# Patient Record
Sex: Male | Born: 2000 | Race: White | Hispanic: No | Marital: Single
Health system: Southern US, Community
[De-identification: ages and names within clinical notes are randomized; demographics above are authoritative.]

---
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
## 2020-04-10 DIAGNOSIS — Z0000 Encounter for general adult medical examination without abnormal findings: Secondary | ICD-10-CM | POA: Diagnosis not present

## 2020-04-10 DIAGNOSIS — Z1159 Encounter for screening for other viral diseases: Secondary | ICD-10-CM | POA: Diagnosis not present

## 2020-04-17 DIAGNOSIS — Z111 Encounter for screening for respiratory tuberculosis: Secondary | ICD-10-CM | POA: Diagnosis not present

## 2020-04-17 DIAGNOSIS — Z23 Encounter for immunization: Secondary | ICD-10-CM | POA: Diagnosis not present

## 2020-06-14 DIAGNOSIS — Z23 Encounter for immunization: Secondary | ICD-10-CM | POA: Diagnosis not present

## 2020-11-01 ENCOUNTER — Ambulatory Visit: Payer: Self-pay

## 2020-11-05 ENCOUNTER — Ambulatory Visit: Payer: Self-pay | Attending: Internal Medicine

## 2020-11-05 DIAGNOSIS — Z23 Encounter for immunization: Secondary | ICD-10-CM

## 2020-11-05 NOTE — Progress Notes (Signed)
   Covid-19 Vaccination Clinic  Name:  Steven Robbins    MRN: [id number] DOB: December 29, 2000  11/05/2020  Mr. Steven Robbins was observed post Covid-19 immunization for 15 minutes without incident. He was provided with Vaccine Information Sheet and instruction to access the V-Safe system.   Mr. Steven Robbins was instructed to call 911 with any severe reactions post vaccine: Marland Kitchen Difficulty breathing  . Swelling of face and throat  . A fast heartbeat  . A bad rash all over body  . Dizziness and weakness   Immunizations Administered    Name Date Dose VIS Date Route   Pfizer COVID-19 Vaccine 11/05/2020  1:53 PM 0.3 mL 09/04/2020 Intramuscular   Manufacturer: [employer], [employer]   Lot: [id number]   NDC: [id number]

## 2022-01-12 ENCOUNTER — Emergency Department (HOSPITAL_COMMUNITY)
Admission: EM | Admit: 2022-01-12 | Discharge: 2022-01-13 | Discharge status: Against Medical Advice | Payer: BC Managed Care – PPO | Attending: Emergency Medicine | Admitting: Emergency Medicine

## 2022-01-12 ENCOUNTER — Encounter (HOSPITAL_COMMUNITY): Payer: Self-pay

## 2022-01-12 DIAGNOSIS — Z5321 Procedure and treatment not carried out due to patient leaving prior to being seen by health care provider: Secondary | ICD-10-CM | POA: Diagnosis not present

## 2022-01-12 DIAGNOSIS — R1031 Right lower quadrant pain: Secondary | ICD-10-CM | POA: Insufficient documentation

## 2022-01-12 DIAGNOSIS — R109 Unspecified abdominal pain: Secondary | ICD-10-CM | POA: Diagnosis not present

## 2022-01-12 DIAGNOSIS — R1110 Vomiting, unspecified: Secondary | ICD-10-CM | POA: Diagnosis not present

## 2022-01-12 DIAGNOSIS — R112 Nausea with vomiting, unspecified: Secondary | ICD-10-CM | POA: Diagnosis not present

## 2022-01-12 MED ORDER — ONDANSETRON HCL 4 MG/2ML IJ SOLN
4.0000 mg | Freq: Once | INTRAMUSCULAR | Status: AC
  Administered 2022-01-12: 4 mg via INTRAVENOUS
  Filled 2022-01-12: qty 2

## 2022-01-12 MED ORDER — ONDANSETRON 4 MG PO TBDP: 4.0000 mg | ORAL_TABLET | Freq: Once | ORAL | Status: DC

## 2022-01-12 NOTE — ED Provider Triage Note (Signed)
Emergency Medicine Provider Triage Evaluation Note  Steven Robbins , a 21 y.o. male  was evaluated in triage.  Pt complains of abdominal pain, nausea, vomiting.  Started around 7:30 this evening, getting worse.   Pain initially central but now more right sided.  Denies fever, diarrhea.  Review of Systems  Positive: Abdominal pain, N/V Negative: fever  Physical Exam  BP 117/71 (BP Location: Right Arm)    Pulse 81    Temp 98.1 F (36.7 C) (Oral)    Resp 16    SpO2 96%  Gen:   Awake, no distress, diaphoretic Resp:  Normal effort  MSK:   Moves extremities without difficulty  Other:  Mildly tender RLQ, vomiting  during triage  Medical Decision Making  Medically screening exam initiated at 11:13 PM.  Appropriate orders placed.  Steven Robbins was informed that the remainder of the evaluation will be completed by another provider, this initial triage assessment does not replace that evaluation, and the importance of remaining in the ED until their evaluation is complete.  N/V, RLQ pain.  He is diaphoretic and vomiting during triage.  Will check labs, CT.   Larene Pickett, PA-C 01/12/22 2314

## 2022-01-12 NOTE — ED Triage Notes (Signed)
Pt states that since around 730pm he has been having RLQ pain with vomiting, denies diarrhea or fevers.

## 2022-01-13 ENCOUNTER — Emergency Department (HOSPITAL_COMMUNITY): Payer: BC Managed Care – PPO

## 2022-01-13 DIAGNOSIS — R1110 Vomiting, unspecified: Secondary | ICD-10-CM | POA: Diagnosis not present

## 2022-01-13 DIAGNOSIS — R109 Unspecified abdominal pain: Secondary | ICD-10-CM | POA: Diagnosis not present

## 2022-01-13 LAB — COMPREHENSIVE METABOLIC PANEL
ALT: 29 U/L (ref 0–44)
AST: 36 U/L (ref 15–41)
Albumin: 4.9 g/dL (ref 3.5–5.0)
Alkaline Phosphatase: 80 U/L (ref 38–126)
Anion gap: 13 (ref 5–15)
BUN: 23 mg/dL — ABNORMAL HIGH (ref 6–20)
CO2: 21 mmol/L — ABNORMAL LOW (ref 22–32)
Calcium: 9.8 mg/dL (ref 8.9–10.3)
Chloride: 104 mmol/L (ref 98–111)
Creatinine, Ser: 1.04 mg/dL (ref 0.61–1.24)
GFR, Estimated: >60 mL/min (ref >60)
Glucose, Bld: 109 mg/dL — ABNORMAL HIGH (ref 70–99)
Potassium: 4.4 mmol/L (ref 3.5–5.1)
Sodium: 138 mmol/L (ref 135–145)
Total Bilirubin: 0.8 mg/dL (ref 0.3–1.2)
Total Protein: 7.7 g/dL (ref 6.5–8.1)

## 2022-01-13 LAB — URINALYSIS, ROUTINE W REFLEX MICROSCOPIC
Bilirubin Urine: NEGATIVE
Glucose, UA: NEGATIVE mg/dL
Hgb urine dipstick: NEGATIVE
Ketones, ur: 20 mg/dL — AB
Leukocytes,Ua: NEGATIVE
Nitrite: NEGATIVE
Protein, ur: NEGATIVE mg/dL
Specific Gravity, Urine: 1.026 (ref 1.005–1.030)
pH: 5 (ref 5.0–8.0)

## 2022-01-13 LAB — CBC WITH DIFFERENTIAL/PLATELET
Abs Immature Granulocytes: 0.03 10*3/uL (ref 0.00–0.07)
Basophils Absolute: 0 10*3/uL (ref 0.0–0.1)
Basophils Relative: 0 %
Eosinophils Absolute: 0.1 10*3/uL (ref 0.0–0.5)
Eosinophils Relative: 1 %
HCT: 49 % (ref 39.0–52.0)
Hemoglobin: 16.5 g/dL (ref 13.0–17.0)
Immature Granulocytes: 0 %
Lymphocytes Relative: 14 %
Lymphs Abs: 2 10*3/uL (ref 0.7–4.0)
MCH: 30.1 pg (ref 26.0–34.0)
MCHC: 33.7 g/dL (ref 30.0–36.0)
MCV: 89.4 fL (ref 80.0–100.0)
Monocytes Absolute: 0.5 10*3/uL (ref 0.1–1.0)
Monocytes Relative: 4 %
Neutro Abs: 11.2 10*3/uL — ABNORMAL HIGH (ref 1.7–7.7)
Neutrophils Relative %: 81 %
Platelets: 204 10*3/uL (ref 150–400)
RBC: 5.48 MIL/uL (ref 4.22–5.81)
RDW: 12.2 % (ref 11.5–15.5)
WBC: 13.9 10*3/uL — ABNORMAL HIGH (ref 4.0–10.5)
nRBC: 0 % (ref 0.0–0.2)

## 2022-01-13 LAB — LIPASE, BLOOD: Lipase: 76 U/L — ABNORMAL HIGH (ref 11–51)

## 2022-01-13 MED ORDER — IOHEXOL 300 MG/ML  SOLN
90.0000 mL | Freq: Once | INTRAMUSCULAR | Status: AC | PRN
  Administered 2022-01-13: 90 mL via INTRAVENOUS

## 2022-01-13 NOTE — ED Notes (Signed)
Called pt 3x, no response.  

## 2022-01-14 DIAGNOSIS — K529 Noninfective gastroenteritis and colitis, unspecified: Secondary | ICD-10-CM | POA: Diagnosis not present

## 2022-03-17 DIAGNOSIS — M25511 Pain in right shoulder: Secondary | ICD-10-CM | POA: Diagnosis not present

## 2022-03-17 DIAGNOSIS — M25561 Pain in right knee: Secondary | ICD-10-CM | POA: Diagnosis not present

## 2022-03-17 DIAGNOSIS — M25512 Pain in left shoulder: Secondary | ICD-10-CM | POA: Diagnosis not present

## 2022-03-17 DIAGNOSIS — M25562 Pain in left knee: Secondary | ICD-10-CM | POA: Diagnosis not present

## 2022-03-26 DIAGNOSIS — M25561 Pain in right knee: Secondary | ICD-10-CM | POA: Diagnosis not present

## 2022-04-02 DIAGNOSIS — M25561 Pain in right knee: Secondary | ICD-10-CM | POA: Diagnosis not present

## 2022-04-22 DIAGNOSIS — M25561 Pain in right knee: Secondary | ICD-10-CM | POA: Diagnosis not present

## 2022-05-05 DIAGNOSIS — M25562 Pain in left knee: Secondary | ICD-10-CM | POA: Diagnosis not present

## 2022-05-05 DIAGNOSIS — M25511 Pain in right shoulder: Secondary | ICD-10-CM | POA: Diagnosis not present

## 2022-05-12 DIAGNOSIS — M25511 Pain in right shoulder: Secondary | ICD-10-CM | POA: Diagnosis not present

## 2022-05-21 DIAGNOSIS — X58XXXA Exposure to other specified factors, initial encounter: Secondary | ICD-10-CM | POA: Diagnosis not present

## 2022-05-21 DIAGNOSIS — G8918 Other acute postprocedural pain: Secondary | ICD-10-CM | POA: Diagnosis not present

## 2022-05-21 DIAGNOSIS — S43431A Superior glenoid labrum lesion of right shoulder, initial encounter: Secondary | ICD-10-CM | POA: Diagnosis not present

## 2022-05-21 DIAGNOSIS — S43001A Unspecified subluxation of right shoulder joint, initial encounter: Secondary | ICD-10-CM | POA: Diagnosis not present

## 2022-05-21 DIAGNOSIS — Y999 Unspecified external cause status: Secondary | ICD-10-CM | POA: Diagnosis not present

## 2022-05-21 DIAGNOSIS — M24111 Other articular cartilage disorders, right shoulder: Secondary | ICD-10-CM | POA: Diagnosis not present

## 2022-05-21 DIAGNOSIS — S43491A Other sprain of right shoulder joint, initial encounter: Secondary | ICD-10-CM | POA: Diagnosis not present

## 2022-05-25 DIAGNOSIS — M24411 Recurrent dislocation, right shoulder: Secondary | ICD-10-CM | POA: Diagnosis not present

## 2022-06-01 DIAGNOSIS — M24411 Recurrent dislocation, right shoulder: Secondary | ICD-10-CM | POA: Diagnosis not present

## 2022-06-08 DIAGNOSIS — M24411 Recurrent dislocation, right shoulder: Secondary | ICD-10-CM | POA: Diagnosis not present

## 2022-06-09 DIAGNOSIS — M25512 Pain in left shoulder: Secondary | ICD-10-CM | POA: Diagnosis not present

## 2022-06-09 DIAGNOSIS — M25561 Pain in right knee: Secondary | ICD-10-CM | POA: Diagnosis not present

## 2022-06-09 DIAGNOSIS — S43431D Superior glenoid labrum lesion of right shoulder, subsequent encounter: Secondary | ICD-10-CM | POA: Diagnosis not present

## 2022-06-09 DIAGNOSIS — M25562 Pain in left knee: Secondary | ICD-10-CM | POA: Diagnosis not present

## 2022-06-15 DIAGNOSIS — M24411 Recurrent dislocation, right shoulder: Secondary | ICD-10-CM | POA: Diagnosis not present

## 2022-06-18 DIAGNOSIS — M25561 Pain in right knee: Secondary | ICD-10-CM | POA: Diagnosis not present

## 2022-06-23 DIAGNOSIS — M25561 Pain in right knee: Secondary | ICD-10-CM | POA: Diagnosis not present

## 2022-06-23 DIAGNOSIS — M24411 Recurrent dislocation, right shoulder: Secondary | ICD-10-CM | POA: Diagnosis not present

## 2022-06-29 DIAGNOSIS — M24411 Recurrent dislocation, right shoulder: Secondary | ICD-10-CM | POA: Diagnosis not present

## 2022-07-06 DIAGNOSIS — M6289 Other specified disorders of muscle: Secondary | ICD-10-CM | POA: Diagnosis not present

## 2022-07-06 DIAGNOSIS — M23362 Other meniscus derangements, other lateral meniscus, left knee: Secondary | ICD-10-CM | POA: Diagnosis not present

## 2022-07-06 DIAGNOSIS — S83281A Other tear of lateral meniscus, current injury, right knee, initial encounter: Secondary | ICD-10-CM | POA: Diagnosis not present

## 2022-07-06 DIAGNOSIS — S76311A Strain of muscle, fascia and tendon of the posterior muscle group at thigh level, right thigh, initial encounter: Secondary | ICD-10-CM | POA: Diagnosis not present

## 2022-07-09 DIAGNOSIS — S76311D Strain of muscle, fascia and tendon of the posterior muscle group at thigh level, right thigh, subsequent encounter: Secondary | ICD-10-CM | POA: Diagnosis not present

## 2022-07-09 DIAGNOSIS — M23300 Other meniscus derangements, unspecified lateral meniscus, right knee: Secondary | ICD-10-CM | POA: Diagnosis not present

## 2022-07-13 DIAGNOSIS — M24411 Recurrent dislocation, right shoulder: Secondary | ICD-10-CM | POA: Diagnosis not present

## 2022-09-01 DIAGNOSIS — S76311D Strain of muscle, fascia and tendon of the posterior muscle group at thigh level, right thigh, subsequent encounter: Secondary | ICD-10-CM | POA: Diagnosis not present

## 2022-09-30 DIAGNOSIS — M25521 Pain in right elbow: Secondary | ICD-10-CM | POA: Diagnosis not present

## 2022-09-30 DIAGNOSIS — M25512 Pain in left shoulder: Secondary | ICD-10-CM | POA: Diagnosis not present

## 2022-10-06 DIAGNOSIS — M25512 Pain in left shoulder: Secondary | ICD-10-CM | POA: Diagnosis not present

## 2022-10-07 DIAGNOSIS — S46012D Strain of muscle(s) and tendon(s) of the rotator cuff of left shoulder, subsequent encounter: Secondary | ICD-10-CM | POA: Diagnosis not present

## 2022-12-08 IMAGING — CT CT ABD-PELV W/ CM
2 of 4 series · 16 of 46 positions shown, 18 images · IV contrast (agent unspecified)
Comparison: None.

CLINICAL DATA: Right lower quadrant abdominal pain, vomiting.

EXAM:
CT ABDOMEN AND PELVIS WITH CONTRAST
TECHNIQUE: Multidetector CT imaging of the abdomen and pelvis was performed
using the standard protocol following bolus administration of
intravenous contrast.

[Series 3: abdomen 5.0 · axial · 0.98mm/px · z∈[+1059,+1459]mm · 13 of 92 slices shown, 15 images]
[im 6/92  soft-tissue]
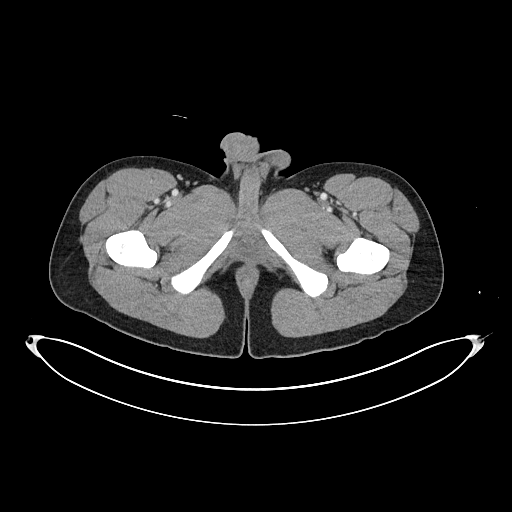
[im 6/92  bone]
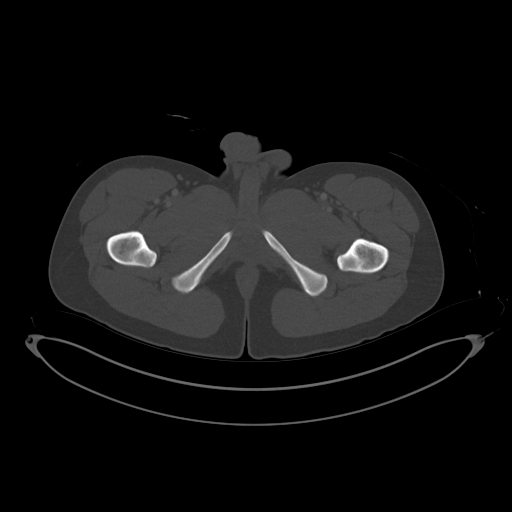
[im 11/92  soft-tissue]
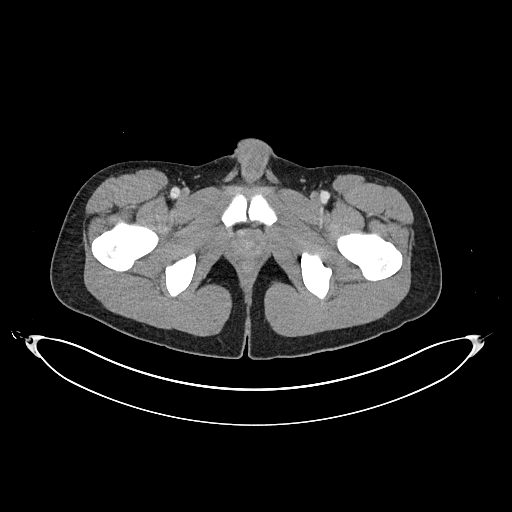
[im 21/92  soft-tissue]
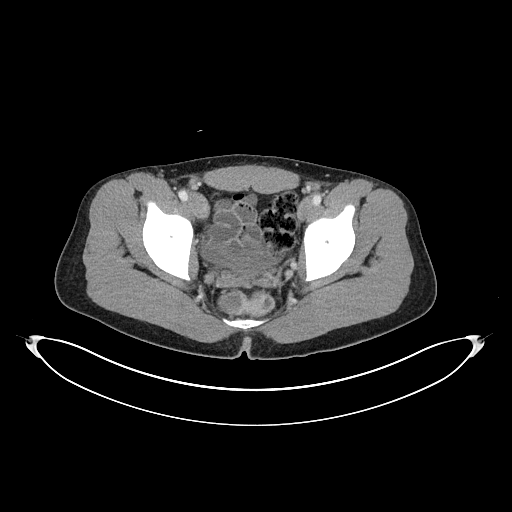
[im 26/92  soft-tissue]
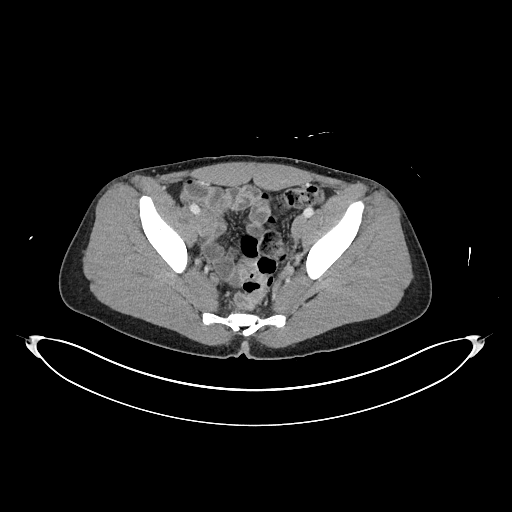
[im 31/92  soft-tissue]
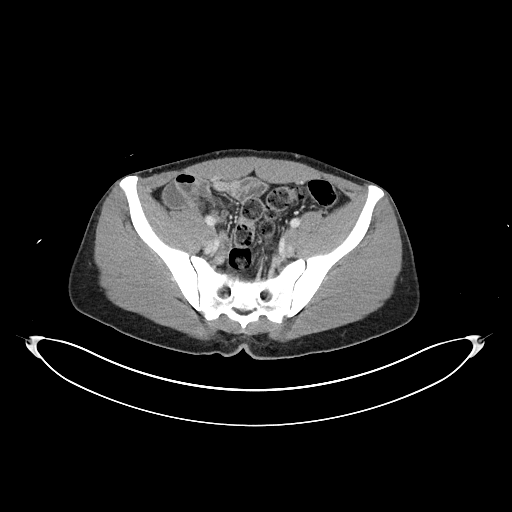
[im 41/92  soft-tissue]
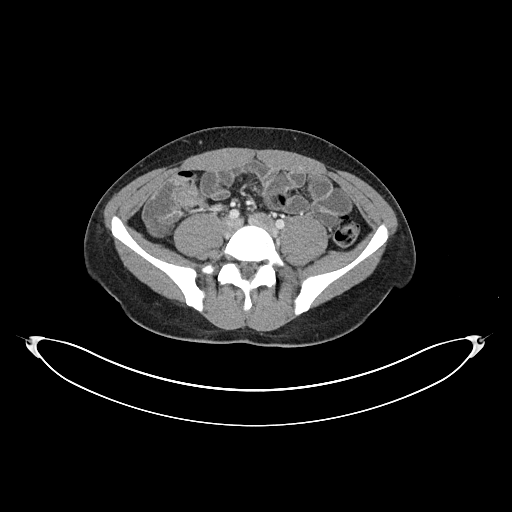
[im 46/92  soft-tissue]
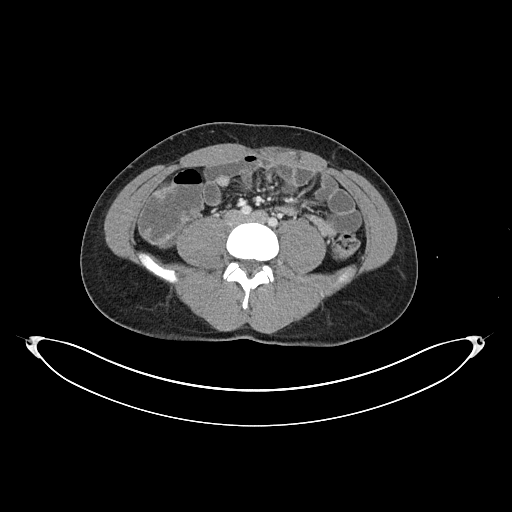
[im 51/92  soft-tissue]
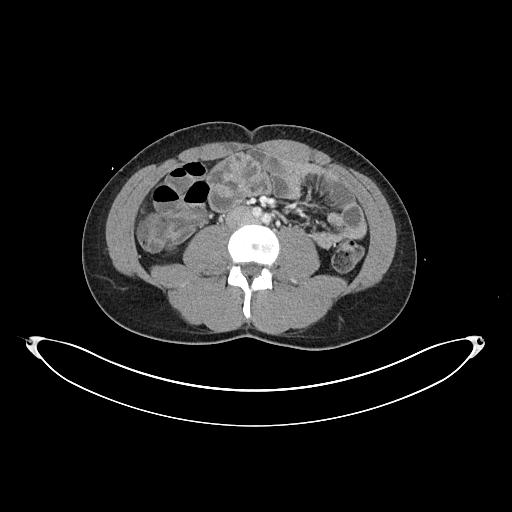
[im 61/92  soft-tissue]
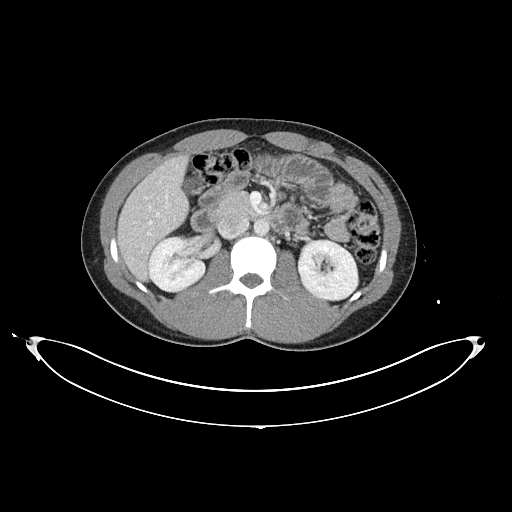
[im 61/92  bone]
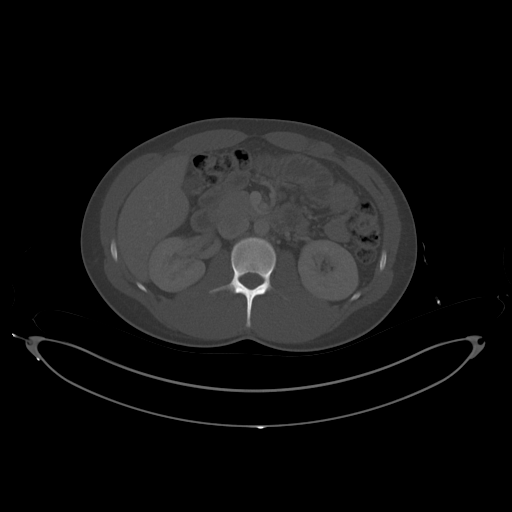
[im 66/92  soft-tissue]
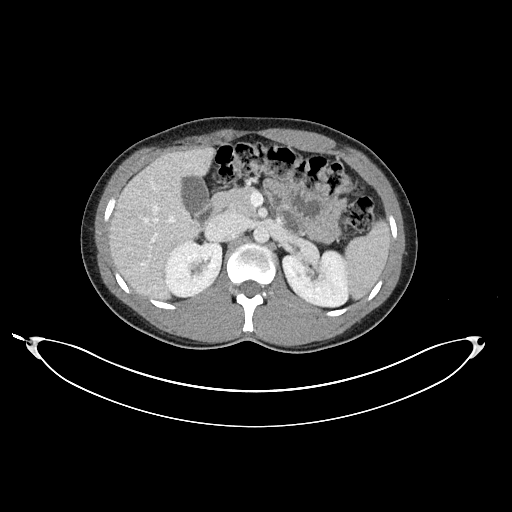
[im 71/92  soft-tissue]
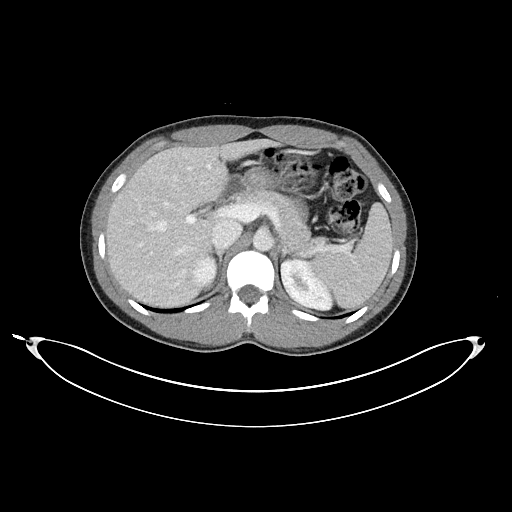
[im 81/92  soft-tissue]
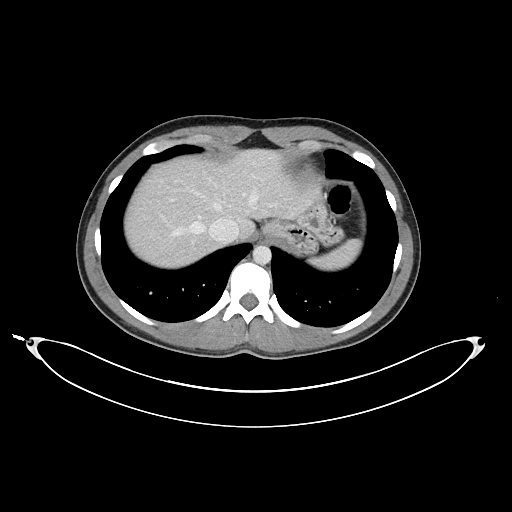
[im 86/92  soft-tissue]
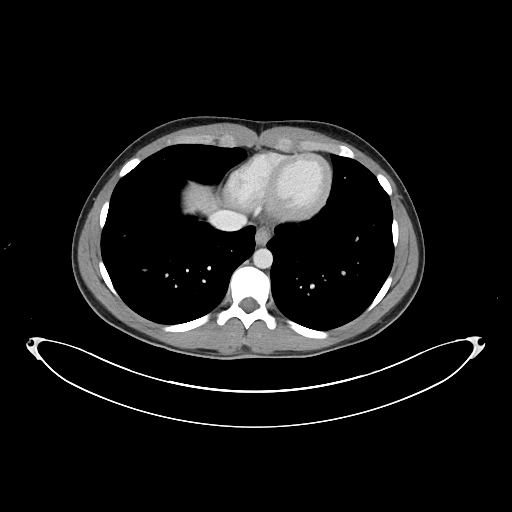

[Series 6: abdomen 3.0 mpr cor · coronal · 0.76mm/px · 3 of 83 slices shown]
[im 28/83  soft-tissue]
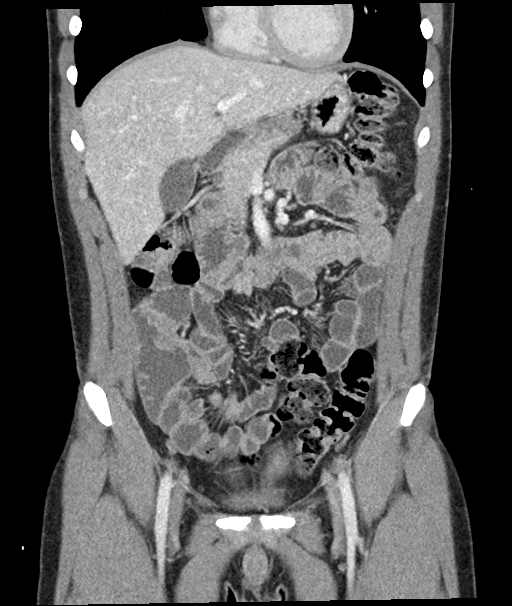
[im 37/83  soft-tissue]
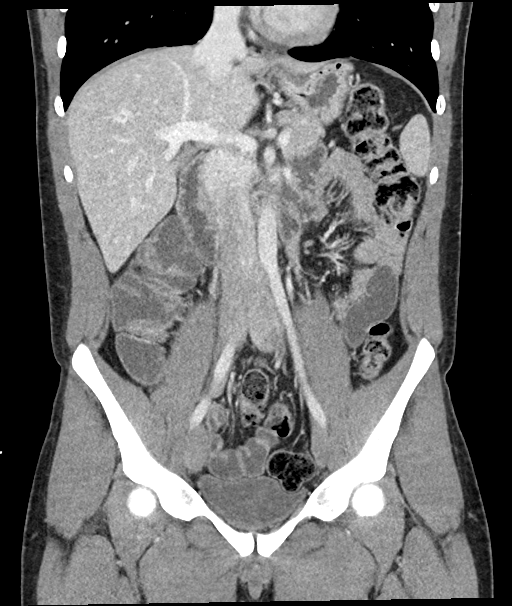
[im 46/83  soft-tissue]
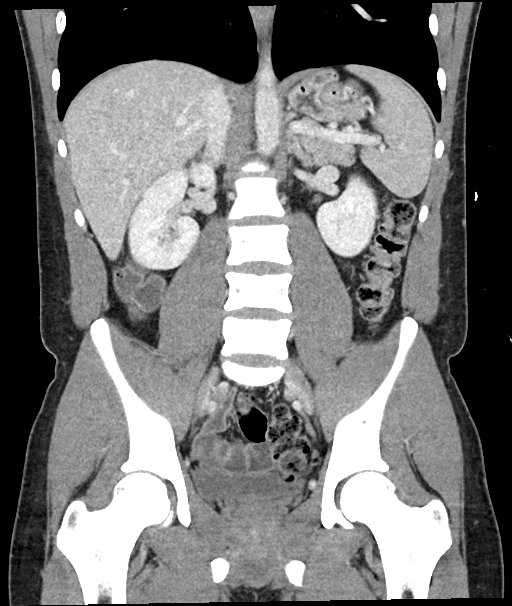

[16 of 46 positions shown; findings below may reference images not displayed]

RADIATION DOSE REDUCTION: This exam was performed according to the
departmental dose-optimization program which includes automated
exposure control, adjustment of the mA and/or kV according to
patient size and/or use of iterative reconstruction technique.

CONTRAST:  90mL OMNIPAQUE IOHEXOL 300 MG/ML  SOLN
FINDINGS: Lower chest: No acute abnormality.

Hepatobiliary: No focal liver abnormality is seen. No gallstones,
gallbladder wall thickening, or biliary dilatation.

Pancreas: Unremarkable. No pancreatic ductal dilatation or
surrounding inflammatory changes.

Spleen: Normal in size without focal abnormality.

Adrenals/Urinary Tract: Adrenal glands are unremarkable. Kidneys are
normal, without renal calculi, focal lesion, or hydronephrosis.
Bladder is unremarkable.

Stomach/Bowel: The stomach is nondistended. No bowel obstruction,
free air, or pneumatosis. The visualized portion of the appendix
appendix is normal in caliber, best seen on sagittal image 49. There
is mild thickening of the walls of the terminal ileum. Nonspecific
air-fluid levels are noted in the small bowel and proximal colon

Vascular/Lymphatic: No significant vascular findings are present. No
enlarged abdominal or pelvic lymph nodes.

Reproductive: Prostate is unremarkable.

Other: No abdominal wall hernia or abnormality. No abdominopelvic
ascites.

Musculoskeletal: No acute or significant osseous findings.
IMPRESSION: 1. Mild thickening of the walls of the terminal ileum with
nonspecific air-fluid levels in the small bowel and proximal colon,
possible enteritis/ileitis. No bowel obstruction or free air.
2. The visualized portion the appendix is normal in caliber.

## 2022-12-10 DIAGNOSIS — S46012D Strain of muscle(s) and tendon(s) of the rotator cuff of left shoulder, subsequent encounter: Secondary | ICD-10-CM | POA: Diagnosis not present

## 2023-04-15 DIAGNOSIS — M778 Other enthesopathies, not elsewhere classified: Secondary | ICD-10-CM | POA: Diagnosis not present

## 2023-04-15 DIAGNOSIS — M25521 Pain in right elbow: Secondary | ICD-10-CM | POA: Diagnosis not present

## 2023-04-15 DIAGNOSIS — G8929 Other chronic pain: Secondary | ICD-10-CM | POA: Diagnosis not present

## 2023-05-13 DIAGNOSIS — M25521 Pain in right elbow: Secondary | ICD-10-CM | POA: Diagnosis not present

## 2023-05-13 DIAGNOSIS — G8929 Other chronic pain: Secondary | ICD-10-CM | POA: Diagnosis not present

## 2023-06-22 DIAGNOSIS — M25521 Pain in right elbow: Secondary | ICD-10-CM | POA: Diagnosis not present

## 2023-06-22 DIAGNOSIS — M778 Other enthesopathies, not elsewhere classified: Secondary | ICD-10-CM | POA: Diagnosis not present

## 2023-06-22 DIAGNOSIS — G8929 Other chronic pain: Secondary | ICD-10-CM | POA: Diagnosis not present

## 2023-10-12 DIAGNOSIS — G8929 Other chronic pain: Secondary | ICD-10-CM | POA: Diagnosis not present

## 2023-10-12 DIAGNOSIS — M25521 Pain in right elbow: Secondary | ICD-10-CM | POA: Diagnosis not present

## 2023-10-12 DIAGNOSIS — M778 Other enthesopathies, not elsewhere classified: Secondary | ICD-10-CM | POA: Diagnosis not present

## 2023-10-13 DIAGNOSIS — G8929 Other chronic pain: Secondary | ICD-10-CM | POA: Diagnosis not present

## 2023-10-13 DIAGNOSIS — M778 Other enthesopathies, not elsewhere classified: Secondary | ICD-10-CM | POA: Diagnosis not present

## 2023-10-13 DIAGNOSIS — M25521 Pain in right elbow: Secondary | ICD-10-CM | POA: Diagnosis not present

## 2023-10-20 DIAGNOSIS — M25521 Pain in right elbow: Secondary | ICD-10-CM | POA: Diagnosis not present

## 2023-10-20 DIAGNOSIS — G8929 Other chronic pain: Secondary | ICD-10-CM | POA: Diagnosis not present

## 2023-10-20 DIAGNOSIS — M778 Other enthesopathies, not elsewhere classified: Secondary | ICD-10-CM | POA: Diagnosis not present

## 2024-04-04 NOTE — Progress Notes (Signed)
 New Patient Note  RE: Steven Robbins MRN: [id number] DOB: [birth year] Date of Office Visit: 04/05/2024  Consult requested by: Ransom Byers, * Primary care provider: Ransom Byers, MD  Chief Complaint: ALLERGY and Establish Care  History of Present Illness: I had the pleasure of seeing Steven Robbins for initial evaluation at the [facility] on 04/07/2024. He is a 23 y.o. male, who is self-referred here for the evaluation of possible penicillin allergy.  Discussed the use of AI scribe software for clinical note transcription with the patient, who gave verbal consent to proceed.    Steven Robbins is a 23 year old male who presents with uncertainty about a penicillin allergy.  He is seeking clarification regarding a potential penicillin allergy. He initially believed he was allergic to penicillin, as he had noted this on medical paperwork for the [employer]. However, after discussing with his parents, it was revealed that his brother is confirmed to be allergic, but there is no clear recollection of him having the same allergy.  He does not recall any specific reactions to penicillin, although his medical records indicate he was prescribed penicillin for ear infections in the past, including a course of Augmentin  in 2019. He frequently experienced ear infections as a child but does not remember any adverse reactions to antibiotics.  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  No history of asthma, food allergies, environmental allergies, or other drug reactions. He also reports no recent illnesses requiring antibiotics, no fevers, chills, or respiratory issues, and no family history of eczema or allergies.     Assessment and Plan: Steven Robbins is a 23 y.o. male with: Penicillin allergy status uncertain He was  uncertain about his penicillin allergy status as he thought he was allergic to penicillin but parents told him his sibling was the one who was allergic to it. EMR records indicated that he had Augmentin  prescribed in 2019 and no mention of any adverse reactions noted in his chart.  Please check with your recruiter if they even need to you to do this penicillin work up. From the chart review - you were last given Augmentin  in 2019 for an ear infection. I'm unable to see if you were prescribed antibiotics outside of our system.  If they do require you to "clear" this allergy then offered skin testing and drug challenge - this can be done both on the same day.  Return if symptoms worsen or fail to improve.  No orders of the defined types were placed in this encounter.  Lab Orders  No laboratory test(s) ordered today    Other allergy screening: Asthma: no Rhino conjunctivitis: no Food allergy: no Hymenoptera allergy: no Urticaria: no Eczema:no History of recurrent infections suggestive of immunodeficency: no  Diagnostics: None.   Past Medical History: There are no active problems to display for this patient.  History reviewed. No pertinent past medical history. Past Surgical History: History reviewed. No pertinent surgical history. Medication List:  Current Outpatient Medications  Medication Sig Dispense Refill   ondansetron  (ZOFRAN ) 4 MG tablet Take 1 tablet (4 mg total) by mouth every 8 (eight) hours as needed for nausea or vomiting. 30 tablet 0   No current facility-administered medications for this visit.   Allergies: No Known Allergies Social History: Social History   Socioeconomic History   Marital status: Single  Spouse name: Not on file   Number of children: Not on file   Years of education: Not on file   Highest education level: Not on file  Occupational History   Not on file  Tobacco Use   Smoking status: Never    Passive exposure: Never   Smokeless  tobacco: Never  Vaping Use   Vaping status: Never Used  Substance and Sexual Activity   Alcohol use: Never   Drug use: Never   Sexual activity: Not on file  Other Topics Concern   Not on file  Social History Narrative   Not on file   Social Drivers of Health   Financial Resource Strain: Low Risk  (10/12/2023)   Received from [facility]   Overall Financial Resource Strain (CARDIA)    Difficulty of Paying Living Expenses: Not hard at all  Food Insecurity: No Food Insecurity (10/12/2023)   Received from [facility]   Hunger Vital Sign    Worried About Running Out of Food in the Last Year: Never true    Ran Out of Food in the Last Year: Never true  Transportation Needs: No Transportation Needs (10/12/2023)   [facility] - Transportation    In the past 12 months, has lack of transportation kept you from medical appointments or from getting medications?: No    Lack of Transportation (Non-Medical): No  Physical Activity: Not on file  Stress: Not on file  Social Connections: Not on file   Lives in a house. Smoking: denies Occupation: Hydrographic surveyor HistorySurveyor, minerals in the house: no Engineer, civil (consulting) in the family room: no Carpet in the bedroom: no Heating: electric Cooling: central Pet: yes 1 dog  Family History: Family History  Problem Relation Age of Onset   Allergic rhinitis Brother    Review of Systems  Constitutional:  Negative for appetite change, chills, fever and unexpected weight change.  HENT:  Negative for congestion and rhinorrhea.   Eyes:  Negative for itching.  Respiratory:  Negative for cough, chest tightness, shortness of breath and wheezing.   Cardiovascular:  Negative for chest pain.  Gastrointestinal:  Negative for abdominal pain.  Genitourinary:  Negative for difficulty urinating.  Skin:  Negative for rash.  Neurological:  Negative for headaches.     Objective: BP 116/68   Pulse 74   Temp 98.6 F (37 C)   Ht 5' 7.32" (1.71 m)   Wt 160 lb (72.6 kg)   SpO2 96%   BMI 24.82 kg/m  Body mass index is 24.82 kg/m. Physical Exam Vitals and nursing note reviewed.  Constitutional:      Appearance: Normal appearance. He is well-developed.  HENT:     Head: Normocephalic and atraumatic.     Right Ear: Tympanic membrane and external ear normal.     Left Ear: Tympanic membrane and external ear normal.     Nose: Nose normal.     Mouth/Throat:     Mouth: Mucous membranes are moist.     Pharynx: Oropharynx is clear.  Eyes:     Conjunctiva/sclera: Conjunctivae normal.  Cardiovascular:     Rate and Rhythm: Normal rate and regular rhythm.     Heart sounds: Normal heart sounds. No murmur heard.    No friction rub. No gallop.  Pulmonary:     Effort: Pulmonary effort is normal.     Breath sounds: Normal breath sounds. No wheezing, rhonchi or rales.  Musculoskeletal:  Cervical back: Neck supple.  Skin:    General: Skin is warm.     Findings: No rash.  Neurological:     Mental Status: He is alert and oriented to person, place, and time.  Psychiatric:        Behavior: Behavior normal.   The plan was reviewed with the patient/family, and all questions/concerned were addressed.  It was my pleasure to see Steven Robbins today and participate in his care. Please feel free to contact me with any questions or concerns.  Sincerely,  Eudelia Hero, DO Allergy & Immunology  [facility]  [place] office: [phone] [facility] office: [phone]

## 2024-04-05 ENCOUNTER — Encounter: Payer: Self-pay | Admitting: Allergy

## 2024-04-05 ENCOUNTER — Other Ambulatory Visit: Payer: Self-pay

## 2024-04-05 ENCOUNTER — Ambulatory Visit (INDEPENDENT_AMBULATORY_CARE_PROVIDER_SITE_OTHER): Admitting: Allergy

## 2024-04-05 VITALS — BP 116/68 | HR 74 | Temp 98.6°F | Ht 67.32 in | Wt 160.0 lb

## 2024-04-05 DIAGNOSIS — Z888 Allergy status to other drugs, medicaments and biological substances status: Secondary | ICD-10-CM

## 2024-04-05 DIAGNOSIS — T50905A Adverse effect of unspecified drugs, medicaments and biological substances, initial encounter: Secondary | ICD-10-CM

## 2024-04-05 NOTE — Patient Instructions (Addendum)
 Please check with your recruiter if they even need to you to do this penicillin work up. From the chart review - you were last given Augmentin  in 2019 for an ear infection. I'm unable to see if you were prescribed antibiotics if done outside of the our system.   If they do require you to "clear" this allergy then recommend skin testing and drug challenge - this can be done both on the same day.  Drug challenge instructions: You must be off antihistamines for 3-5 days before. Must be in good health and not ill. No vaccines/injections/antibiotics within the past 7 days.  Plan on being in the office for 2-3 hours and must bring in the drug you want to do the oral challenge for - will send in prescription to pick up a few days before.  You must call to schedule an appointment and specify it's for a drug challenge.

## 2024-04-07 ENCOUNTER — Encounter: Payer: Self-pay | Admitting: Allergy
